# Patient Record
Sex: Male | Born: 2000 | Hispanic: No | Marital: Single | Smoking: Never smoker
Health system: Southern US, Community
[De-identification: ages and names within clinical notes are randomized; demographics above are authoritative.]

## PROBLEM LIST (undated history)

## (undated) DIAGNOSIS — R278 Other lack of coordination: Secondary | ICD-10-CM

## (undated) DIAGNOSIS — R569 Unspecified convulsions: Secondary | ICD-10-CM

## (undated) DIAGNOSIS — Z87820 Personal history of traumatic brain injury: Secondary | ICD-10-CM

## (undated) DIAGNOSIS — R002 Palpitations: Secondary | ICD-10-CM

## (undated) DIAGNOSIS — F913 Oppositional defiant disorder: Secondary | ICD-10-CM

## (undated) DIAGNOSIS — F909 Attention-deficit hyperactivity disorder, unspecified type: Secondary | ICD-10-CM

## (undated) DIAGNOSIS — T8859XA Other complications of anesthesia, initial encounter: Secondary | ICD-10-CM

## (undated) DIAGNOSIS — T4145XA Adverse effect of unspecified anesthetic, initial encounter: Secondary | ICD-10-CM

## (undated) DIAGNOSIS — E039 Hypothyroidism, unspecified: Secondary | ICD-10-CM

## (undated) DIAGNOSIS — F88 Other disorders of psychological development: Secondary | ICD-10-CM

## (undated) DIAGNOSIS — F429 Obsessive-compulsive disorder, unspecified: Secondary | ICD-10-CM

## (undated) DIAGNOSIS — F840 Autistic disorder: Secondary | ICD-10-CM

## (undated) HISTORY — DX: Oppositional defiant disorder: F91.3

## (undated) HISTORY — DX: Attention-deficit hyperactivity disorder, unspecified type: F90.9

## (undated) HISTORY — PX: COLONOSCOPY: SHX174

## (undated) HISTORY — DX: Hypothyroidism, unspecified: E03.9

## (undated) HISTORY — DX: Autistic disorder: F84.0

## (undated) HISTORY — DX: Other disorders of psychological development: F88

## (undated) HISTORY — DX: Palpitations: R00.2

## (undated) HISTORY — DX: Unspecified convulsions: R56.9

## (undated) HISTORY — DX: Other lack of coordination: R27.8

## (undated) HISTORY — DX: Obsessive-compulsive disorder, unspecified: F42.9

## (undated) HISTORY — PX: DENTAL SURGERY: SHX609

---
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
## 2022-01-09 ENCOUNTER — Ambulatory Visit: Payer: Medicaid Other | Admitting: Pediatrics

## 2022-05-26 ENCOUNTER — Ambulatory Visit (HOSPITAL_COMMUNITY)
Admission: EM | Admit: 2022-05-26 | Discharge: 2022-05-26 | Disposition: A | Discharge status: Home / Self Care | Payer: Medicaid Other | Attending: Physician Assistant | Admitting: Physician Assistant

## 2022-05-26 ENCOUNTER — Encounter (HOSPITAL_COMMUNITY): Payer: Self-pay

## 2022-05-26 DIAGNOSIS — K0889 Other specified disorders of teeth and supporting structures: Secondary | ICD-10-CM | POA: Diagnosis not present

## 2022-05-26 MED ORDER — KETOROLAC TROMETHAMINE 30 MG/ML IJ SOLN
INTRAMUSCULAR | Status: AC
  Filled 2022-05-26: qty 1

## 2022-05-26 MED ORDER — HYDROCODONE-ACETAMINOPHEN 5-325 MG PO TABS: 1.0000 | ORAL_TABLET | Freq: Four times a day (QID) | ORAL | 0 refills | Status: AC | PRN

## 2022-05-26 MED ORDER — AMOXICILLIN-POT CLAVULANATE 875-125 MG PO TABS: 1.0000 | ORAL_TABLET | Freq: Two times a day (BID) | ORAL | 0 refills | Status: AC

## 2022-05-26 MED ORDER — KETOROLAC TROMETHAMINE 30 MG/ML IJ SOLN
30.0000 mg | Freq: Once | INTRAMUSCULAR | Status: AC
Start: 2022-05-26 — End: 2022-05-26
  Administered 2022-05-26: 30 mg via INTRAMUSCULAR

## 2022-05-26 NOTE — Discharge Instructions (Signed)
Take antibiotic as prescribed Can take hydrocodone as needed for pain Follow up with oral surgeon.

## 2022-05-26 NOTE — ED Triage Notes (Signed)
Pt reports dental pain for several days. Reports 4-5 teeth are rotten.
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
